# Patient Record
Sex: Male | Born: 1995 | Race: Black or African American | Hispanic: No | Marital: Single | Smoking: Never smoker
Health system: Southern US, Community
[De-identification: ages and names within clinical notes are randomized; demographics above are authoritative.]

## PROBLEM LIST (undated history)

## (undated) ENCOUNTER — Emergency Department (HOSPITAL_COMMUNITY): Payer: Self-pay | Source: Home / Self Care

---
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
## 2016-02-14 ENCOUNTER — Emergency Department (HOSPITAL_COMMUNITY)
Admission: EM | Admit: 2016-02-14 | Discharge: 2016-02-14 | Disposition: A | Discharge status: Home / Self Care | Payer: Self-pay | Attending: Emergency Medicine | Admitting: Emergency Medicine

## 2016-02-14 ENCOUNTER — Encounter (HOSPITAL_COMMUNITY): Payer: Self-pay

## 2016-02-14 DIAGNOSIS — N472 Paraphimosis: Secondary | ICD-10-CM | POA: Insufficient documentation

## 2016-02-14 DIAGNOSIS — A64 Unspecified sexually transmitted disease: Secondary | ICD-10-CM | POA: Insufficient documentation

## 2016-02-14 LAB — URINALYSIS, ROUTINE W REFLEX MICROSCOPIC
BILIRUBIN URINE: NEGATIVE
Glucose, UA: NEGATIVE mg/dL
KETONES UR: NEGATIVE mg/dL
NITRITE: NEGATIVE
Protein, ur: NEGATIVE mg/dL
Specific Gravity, Urine: 1.019 (ref 1.005–1.030)
pH: 7 (ref 5.0–8.0)

## 2016-02-14 LAB — URINE MICROSCOPIC-ADD ON

## 2016-02-14 MED ORDER — CEFTRIAXONE SODIUM 250 MG IJ SOLR
250.0000 mg | Freq: Once | INTRAMUSCULAR | Status: AC
  Administered 2016-02-14: 250 mg via INTRAMUSCULAR
  Filled 2016-02-14: qty 250

## 2016-02-14 MED ORDER — AZITHROMYCIN 250 MG PO TABS
1000.0000 mg | ORAL_TABLET | Freq: Once | ORAL | Status: AC
  Administered 2016-02-14: 1000 mg via ORAL
  Filled 2016-02-14: qty 4

## 2016-02-14 MED ORDER — STERILE WATER FOR INJECTION IJ SOLN
INTRAMUSCULAR | Status: AC
  Filled 2016-02-14: qty 10

## 2016-02-14 NOTE — Discharge Instructions (Signed)
Mr. Jesse Villanueva,  Nice meeting you! Please follow-up with urology and your primary care provider. Return to the emergency department if you develop fevers, chills, inability to urinate, black/blue penis, increased pain, new/worsening symptoms. Feel better soon!  S. Lane HackerNicole Reggie Welge, PA-C  Paraphimosis Paraphimosis is a serious condition in which the fold of skin that stretches over the tip of the penis (foreskin) becomes stuck when it is pulled back. This condition blocks blood from flowing away from the penis tip, and that causes swelling that gets worse and worse. Paraphimosis needs to be treated right away. CAUSES This condition may be caused by:  A foreskin that is tighter than normal.  Leaving the foreskin pulled back after a procedure, such as after the placement of a catheter.  A foreskin that has been pulled back for too long.  A forceful pulling back of the foreskin.  Infection under the foreskin.  Trauma to the area, such as a hard hit to the the tip of the penis.  Having sex or masturbating.  Hair or clothing that gets wrapped around the penis. RISK FACTORS This condition is more likely to develop in:  Males who have not had their foreskin removed.  Males who have frequent urological procedures.  Males who have poor hygiene.  Males who need help with daily hygiene from a caregiver. SYMPTOMS Symptoms of this condition include:  A foreskin that cannot be returned to its normal position.  Pain, often at the tip of the penis.  Swelling of the penis.  Anxiety.  Trouble urinating.  Skin that is red or bluish at the tip of the penis. DIAGNOSIS This condition may be diagnosed with a physical exam. You may also have X-rays. TREATMENT This condition may be treated with:  A procedure to force the foreskin back over the tip of the penis. Swelling may first be reduced with:  Ice.  A bandage wrapped tightly around the penis.  Needles to drain any pus or blood  that is causing the swelling.  Medicine to relieve pain. Medicine may be given by mouth, through an IV tube, or by an injection to the base of the penis (nerve block).  A procedure in which a small cut (incision) is made in the tightened foreskin to free it and allow it to be pulled back into place (dorsal slit procedure).  Surgery to remove the foreskin (circumcision). This may be done if the foreskin cannot be moved back into place. Most of the time, treatment can be done in a clinic or a health care provider's office. HOME CARE INSTRUCTIONS  Take over-the-counter and prescription medicines only as told by your health care provider.  Apply any creams to the affected area only as told by your health care provider.  If the treated area is covered with gauze or a bandage (dressing), follow instructions from your health care provider about:  When and how you should change your bandage (dressing).  When you should remove your dressing.  Whether the area can get wet.  Wear loose undergarments to avoid applying pressure to the area.  Follow instructions from your health care provider about avoiding sexual activity.  Keep all follow-up visits as told by your health care provider. This is important. SEEK MEDICAL CARE IF:  The treated area of skin does not heal, or it becomes more irritated, red, or bloody.  Urination is difficult or painful.  Pain in the penis continues, even after you take medicine for pain. SEEK IMMEDIATE MEDICAL CARE IF:  You develop a fever.   This information is not intended to replace advice given to you by your health care provider. Make sure you discuss any questions you have with your health care provider.   Document Released: 06/26/2009 Document Revised: 05/20/2015 Document Reviewed: 11/24/2014 Elsevier Interactive Patient Education 2016 [employer].  Sexually Transmitted Disease A sexually transmitted disease (STD) is a disease or infection that may  be passed (transmitted) from person to person, usually during sexual activity. This may happen by way of saliva, semen, blood, vaginal mucus, or urine. Common STDs include:  Gonorrhea.  Chlamydia.  Syphilis.  HIV and AIDS.  Genital herpes.  Hepatitis B and C.  Trichomonas.  Human papillomavirus (HPV).  Pubic lice.  Scabies.  Mites.  Bacterial vaginosis. WHAT ARE CAUSES OF STDs? An STD may be caused by bacteria, a virus, or parasites. STDs are often transmitted during sexual activity if one person is infected. However, they may also be transmitted through nonsexual means. STDs may be transmitted after:   Sexual intercourse with an infected person.  Sharing sex toys with an infected person.  Sharing needles with an infected person or using unclean piercing or tattoo needles.  Having intimate contact with the genitals, mouth, or rectal areas of an infected person.  Exposure to infected fluids during birth. WHAT ARE THE SIGNS AND SYMPTOMS OF STDs? Different STDs have different symptoms. Some people may not have any symptoms. If symptoms are present, they may include:  Painful or bloody urination.  Pain in the pelvis, abdomen, vagina, anus, throat, or eyes.  A skin rash, itching, or irritation.  Growths, ulcerations, blisters, or sores in the genital and anal areas.  Abnormal vaginal discharge with or without bad odor.  Penile discharge in men.  Fever.  Pain or bleeding during sexual intercourse.  Swollen glands in the groin area.  Yellow skin and eyes (jaundice). This is seen with hepatitis.  Swollen testicles.  Infertility.  Sores and blisters in the mouth. HOW ARE STDs DIAGNOSED? To make a diagnosis, your health care provider may:  Take a medical history.  Perform a physical exam.  Take a sample of any discharge to examine.  Swab the throat, cervix, opening to the penis, rectum, or vagina for testing.  Test a sample of your first morning  urine.  Perform blood tests.  Perform a Pap test, if this applies.  Perform a colposcopy.  Perform a laparoscopy. HOW ARE STDs TREATED? Treatment depends on the STD. Some STDs may be treated but not cured.  Chlamydia, gonorrhea, trichomonas, and syphilis can be cured with antibiotic medicine.  Genital herpes, hepatitis, and HIV can be treated, but not cured, with prescribed medicines. The medicines lessen symptoms.  Genital warts from HPV can be treated with medicine or by freezing, burning (electrocautery), or surgery. Warts may come back.  HPV cannot be cured with medicine or surgery. However, abnormal areas may be removed from the cervix, vagina, or vulva.  If your diagnosis is confirmed, your recent sexual partners need treatment. This is true even if they are symptom-free or have a negative culture or evaluation. They should not have sex until their health care providers say it is okay.  Your health care provider may test you for infection again 3 months after treatment. HOW CAN I REDUCE MY RISK OF GETTING AN STD? Take these steps to reduce your risk of getting an STD:  Use latex condoms, dental dams, and water-soluble lubricants during sexual activity. Do not use petroleum jelly  or oils.  Avoid having multiple sex partners.  Do not have sex with someone who has other sex partners  Do not have sex with anyone you do not know or who is at high risk for an STD.  Avoid risky sex practices that can break your skin.  Do not have sex if you have open sores on your mouth or skin.  Avoid drinking too much alcohol or taking illegal drugs. Alcohol and drugs can affect your judgment and put you in a vulnerable position.  Avoid engaging in oral and anal sex acts.  Get vaccinated for HPV and hepatitis. If you have not received these vaccines in the past, talk to your health care provider about whether one or both might be right for you.  If you are at risk of being infected with  HIV, it is recommended that you take a prescription medicine daily to prevent HIV infection. This is called pre-exposure prophylaxis (PrEP). You are considered at risk if:  You are a man who has sex with other men (MSM).  You are a heterosexual man or woman and are sexually active with more than one partner.  You take drugs by injection.  You are sexually active with a partner who has HIV.  Talk with your health care provider about whether you are at high risk of being infected with HIV. If you choose to begin PrEP, you should first be tested for HIV. You should then be tested every 3 months for as long as you are taking PrEP. WHAT SHOULD I DO IF I THINK I HAVE AN STD?  See your health care provider.  Tell your sexual partner(s). They should be tested and treated for any STDs.  Do not have sex until your health care provider says it is okay. WHEN SHOULD I GET IMMEDIATE MEDICAL CARE? Contact your health care provider right away if:   You have severe abdominal pain.  You are a man and notice swelling or pain in your testicles.  You are a woman and notice swelling or pain in your vagina.   This information is not intended to replace advice given to you by your health care provider. Make sure you discuss any questions you have with your health care provider.   Document Released: 11/19/2002 Document Revised: 09/19/2014 Document Reviewed: 03/19/2013 Elsevier Interactive Patient Education 2016 Elsevier Inc.   Paraphimosis Paraphimosis is a serious condition in which the fold of skin that stretches over the tip of the penis (foreskin) becomes stuck when it is pulled back. This condition blocks blood from flowing away from the penis tip, and that causes swelling that gets worse and worse. Paraphimosis needs to be treated right away. CAUSES This condition may be caused by:  A foreskin that is tighter than normal.  Leaving the foreskin pulled back after a procedure, such as after the  placement of a catheter.  A foreskin that has been pulled back for too long.  A forceful pulling back of the foreskin.  Infection under the foreskin.  Trauma to the area, such as a hard hit to the the tip of the penis.  Having sex or masturbating.  Hair or clothing that gets wrapped around the penis. RISK FACTORS This condition is more likely to develop in:  Males who have not had their foreskin removed.  Males who have frequent urological procedures.  Males who have poor hygiene.  Males who need help with daily hygiene from a caregiver. SYMPTOMS Symptoms of this condition include:  A foreskin that cannot be returned to its normal position.  Pain, often at the tip of the penis.  Swelling of the penis.  Anxiety.  Trouble urinating.  Skin that is red or bluish at the tip of the penis. DIAGNOSIS This condition may be diagnosed with a physical exam. You may also have X-rays. TREATMENT This condition may be treated with:  A procedure to force the foreskin back over the tip of the penis. Swelling may first be reduced with:  Ice.  A bandage wrapped tightly around the penis.  Needles to drain any pus or blood that is causing the swelling.  Medicine to relieve pain. Medicine may be given by mouth, through an IV tube, or by an injection to the base of the penis (nerve block).  A procedure in which a small cut (incision) is made in the tightened foreskin to free it and allow it to be pulled back into place (dorsal slit procedure).  Surgery to remove the foreskin (circumcision). This may be done if the foreskin cannot be moved back into place. Most of the time, treatment can be done in a clinic or a health care provider's office. HOME CARE INSTRUCTIONS  Take over-the-counter and prescription medicines only as told by your health care provider.  Apply any creams to the affected area only as told by your health care provider.  If the treated area is covered with gauze  or a bandage (dressing), follow instructions from your health care provider about:  When and how you should change your bandage (dressing).  When you should remove your dressing.  Whether the area can get wet.  Wear loose undergarments to avoid applying pressure to the area.  Follow instructions from your health care provider about avoiding sexual activity.  Keep all follow-up visits as told by your health care provider. This is important. SEEK MEDICAL CARE IF:  The treated area of skin does not heal, or it becomes more irritated, red, or bloody.  Urination is difficult or painful.  Pain in the penis continues, even after you take medicine for pain. SEEK IMMEDIATE MEDICAL CARE IF:  You develop a fever.   This information is not intended to replace advice given to you by your health care provider. Make sure you discuss any questions you have with your health care provider.   Document Released: 06/26/2009 Document Revised: 05/20/2015 Document Reviewed: 11/24/2014 Elsevier Interactive Patient Education [employer]

## 2016-02-14 NOTE — ED Notes (Signed)
Patient uncircumcised and developed swelling and discomfort to foreskin this am. Denies discharge, no urinary complaints

## 2016-02-14 NOTE — ED Provider Notes (Signed)
CSN: [id number]     Arrival date & time 02/14/16  1153 History   First MD Initiated Contact with Patient 02/14/16 1320     Chief Complaint  Patient presents with  . foreskin infection    HPI   Jesse Villanueva is a 20 y.o. male presenting with a 1 day history of penile swelling. He states he had swelling at the tip of his penis yesterday, and was able to push it back down. This morning, he awoke, and the swelling returned. He has been unable to reduce it. He denies fevers, chills, N/V, inability to urinate, significant pain, discolorations. He is uncircumcised.    History reviewed. No pertinent past medical history. History reviewed. No pertinent past surgical history. No family history on file. Social History  Substance Use Topics  . Smoking status: Never Smoker   . Smokeless tobacco: None  . Alcohol Use: No    Review of Systems  Ten systems are reviewed and are negative for acute change except as noted in the HPI   Allergies  Review of patient's allergies indicates no known allergies.  Home Medications   Prior to Admission medications   Not on File   BP 133/72 mmHg  Pulse 80  Temp(Src) 98 F (36.7 C) (Oral)  Resp 20  Ht 5\' 11"  (1.803 m)  Wt 72.666 kg  BMI 22.35 kg/m2  SpO2 100% Physical Exam  Constitutional: He appears well-developed and well-nourished. No distress.  HENT:  Head: Normocephalic and atraumatic.  Eyes: Conjunctivae are normal. Right eye exhibits no discharge. Left eye exhibits no discharge. No scleral icterus.  Neck: No tracheal deviation present.  Cardiovascular: Normal rate.   Pulmonary/Chest: Effort normal and breath sounds normal. No respiratory distress.  Abdominal: Soft. Bowel sounds are normal. He exhibits no distension and no mass. There is no tenderness. There is no rebound and no guarding.  Genitourinary:  Chaperoned penile exam: Paraphimosis at urethral surface of penis. Foreskin able to be manually reduced but not completely able to cover  glans penis. No tenderness, discolorations. Urethral discharge noted.   Musculoskeletal: He exhibits no edema.  Lymphadenopathy:    He has no cervical adenopathy.  Neurological: He is alert. Coordination normal.  Skin: Skin is warm and dry. No rash noted. He is not diaphoretic. No erythema.  Psychiatric: He has a normal mood and affect. His behavior is normal.  Nursing note and vitals reviewed.   ED Course  Procedures  Labs Review Labs Reviewed  URINALYSIS, ROUTINE W REFLEX MICROSCOPIC ([facility]) - Abnormal; Notable for the following:    APPearance TURBID (*)    Hgb urine dipstick SMALL (*)    Leukocytes, UA LARGE (*)    All other components within normal limits  URINE MICROSCOPIC-ADD ON - Abnormal; Notable for the following:    Squamous Epithelial / LPF 0-5 (*)    Bacteria, UA RARE (*)    All other components within normal limits  GC/CHLAMYDIA PROBE AMP ([place]) NOT AT [facility]   MDM   Final diagnoses:  Paraphimosis  Sexually transmitted disease   Patient nontoxic appearing, VSS. Partial paraphimosis of penis. No discolorations, tenderness, difficulty urinating. No concern for ischemia. Dr. Adela LankFloyd evaluated patient as well. Manual reduction attempted but not completely sucessful. Patient states he was able to reduce at home but then it reappeared again. Patient treated for STD given penile discharge, leukocytes in urine. Patient to follow-up with urology. Return precautions discussed. Patient in agreement and understanding with the plan.  Lelon MastSamantha  Lane Hacker, PA-C 02/19/16 1401  Melene Plan, DO 02/19/16 1427

## 2016-02-15 LAB — GC/CHLAMYDIA PROBE AMP (~~LOC~~) NOT AT ARMC
Chlamydia: NEGATIVE
Neisseria Gonorrhea: POSITIVE — AB

## 2016-02-16 ENCOUNTER — Telehealth (HOSPITAL_BASED_OUTPATIENT_CLINIC_OR_DEPARTMENT_OTHER): Payer: Self-pay | Admitting: Emergency Medicine

## 2016-04-10 ENCOUNTER — Encounter (HOSPITAL_COMMUNITY): Payer: Self-pay | Admitting: Emergency Medicine

## 2016-04-10 ENCOUNTER — Emergency Department (HOSPITAL_COMMUNITY): Payer: Self-pay

## 2016-04-10 ENCOUNTER — Emergency Department (HOSPITAL_COMMUNITY)
Admission: EM | Admit: 2016-04-10 | Discharge: 2016-04-10 | Disposition: A | Discharge status: Home / Self Care | Payer: Self-pay | Attending: Emergency Medicine | Admitting: Emergency Medicine

## 2016-04-10 DIAGNOSIS — Y999 Unspecified external cause status: Secondary | ICD-10-CM | POA: Insufficient documentation

## 2016-04-10 DIAGNOSIS — S6991XA Unspecified injury of right wrist, hand and finger(s), initial encounter: Secondary | ICD-10-CM

## 2016-04-10 DIAGNOSIS — Z23 Encounter for immunization: Secondary | ICD-10-CM | POA: Insufficient documentation

## 2016-04-10 DIAGNOSIS — Y939 Activity, unspecified: Secondary | ICD-10-CM | POA: Insufficient documentation

## 2016-04-10 DIAGNOSIS — Y929 Unspecified place or not applicable: Secondary | ICD-10-CM | POA: Insufficient documentation

## 2016-04-10 DIAGNOSIS — W2201XA Walked into wall, initial encounter: Secondary | ICD-10-CM | POA: Insufficient documentation

## 2016-04-10 DIAGNOSIS — S60412A Abrasion of right middle finger, initial encounter: Secondary | ICD-10-CM | POA: Insufficient documentation

## 2016-04-10 MED ORDER — TETANUS-DIPHTH-ACELL PERTUSSIS 5-2.5-18.5 LF-MCG/0.5 IM SUSP
0.5000 mL | Freq: Once | INTRAMUSCULAR | Status: AC
  Administered 2016-04-10: 0.5 mL via INTRAMUSCULAR
  Filled 2016-04-10: qty 0.5

## 2016-04-10 MED ORDER — CEPHALEXIN 500 MG PO CAPS: 500.0000 mg | ORAL_CAPSULE | Freq: Four times a day (QID) | ORAL | 0 refills | Status: AC

## 2016-04-10 MED ORDER — CEPHALEXIN 250 MG PO CAPS
500.0000 mg | ORAL_CAPSULE | Freq: Once | ORAL | Status: AC
  Administered 2016-04-10: 500 mg via ORAL
  Filled 2016-04-10: qty 2

## 2016-04-10 NOTE — Discharge Instructions (Signed)
Take the prescribed medication as directed. Follow-up with Dr. Amanda Pea (hand specialist) if not improving over the next few days. Return to the ED for new or worsening symptoms.

## 2016-04-10 NOTE — ED Provider Notes (Signed)
[facility]-EMERGENCY DEPT Provider Note   CSN: [id number] Arrival date & time: 04/10/16  1191  First Provider Contact:  First MD Initiated Contact with Patient 04/10/16 [phone]        History   Chief Complaint Chief Complaint  Patient presents with  . Hand Injury    HPI Jesse Villanueva is a 20 y.o. male.  The history is provided by the patient and a significant other.   19 year old male here with right hand injury. Patient states 2 days ago he got upset and punched a wall. He states he had a abrasion to his knuckle of his middle finger. He states he had significant swelling which seems to be improving. He reports continued pain of his right hand. He also notes some drainage from open wound.  No fever, chills.  No numbness or weakness of right hand.  Patient is right hand dominant.  No meds given PTA.  Date of last tetanus unknown.  History reviewed. No pertinent past medical history.  There are no active problems to display for this patient.   History reviewed. No pertinent surgical history.     Home Medications    Prior to Admission medications   Not on File    Family History No family history on file.  Social History Social History  Substance Use Topics  . Smoking status: Never Smoker  . Smokeless tobacco: Not on file  . Alcohol use No     Allergies   Review of patient's allergies indicates no known allergies.   Review of Systems Review of Systems  Musculoskeletal: Positive for arthralgias.  All other systems reviewed and are negative.    Physical Exam Updated Vital Signs BP 123/86 (BP Location: Left Arm)   Pulse 62   Temp 100.6 F (38.1 C) (Oral)   Resp 18   Ht  (1.778 m)   Wt 70.3 kg   SpO2 100%   BMI 22.24 kg/m   Physical Exam  Constitutional: He is oriented to person, place, and time. He appears well-developed and well-nourished.  HENT:  Head: Normocephalic and atraumatic.  Mouth/Throat: Oropharynx is clear and moist.  Eyes:  Conjunctivae and EOM are normal. Pupils are equal, round, and reactive to light.  Neck: Normal range of motion.  Cardiovascular: Normal rate, regular rhythm and normal heart sounds.   Pulmonary/Chest: Effort normal and breath sounds normal.  Abdominal: Soft. Bowel sounds are normal.  Musculoskeletal: Normal range of motion.       Right hand: He exhibits tenderness, bony tenderness and swelling. He exhibits normal range of motion.       Hands: Right 3rd MCP joint with abrasion noted, slightly open with some drainage noted and generalized swelling surrounding wound; no erythema or induration noted; full ROM of all fingers; normal radial pulse and cap refill; normal sensation throughout hand  Neurological: He is alert and oriented to person, place, and time.  Skin: Skin is warm and dry.  Psychiatric: He has a normal mood and affect.  Nursing note and vitals reviewed.    ED Treatments / Results  Labs (all labs ordered are listed, but only abnormal results are displayed) Labs Reviewed - No data to display  EKG  EKG Interpretation None       Radiology Dg Hand Complete Right  Result Date: 04/10/2016 CLINICAL DATA:  20 year old male punched a wall presenting with right hand pain. EXAM: RIGHT HAND - COMPLETE 3+ VIEW COMPARISON:  None. FINDINGS: There is no acute fracture or dislocation. The bones  are well mineralized. There is soft tissue swelling over the MCP joints. No radiopaque foreign object. IMPRESSION: Negative. Electronically Signed   By: Elgie Collard M.D.   On: 04/10/2016 06:32   Procedures Procedures (including critical care time)  Medications Ordered in ED Medications  Tdap (BOOSTRIX) injection 0.5 mL (not administered)  cephALEXin (KEFLEX) capsule 500 mg (not administered)     Initial Impression / Assessment and Plan / ED Course  I have reviewed the triage vital signs and the nursing notes.  Pertinent labs & imaging results that were available during my care of  the patient were reviewed by me and considered in my medical decision making (see chart for details).  Clinical Course   20 y.o. M here with right hand injury after punching a wall 2 days ago.  Small abrasion noted over 3rd MCP joint with some drainage noted.  Mild swelling without bony deformities.  Hand is neurovascularly intact.  No erythema or induration.  X-ray negative for acute bony deformities.  Tetanus updated.  Will start on abx given drainage.  Discussed home wound care measures. Given hand surgery follow-up if not improving over the next few days.  Discussed plan with patient, he acknowledged understanding and agreed with plan of care.  Return precautions given for new or worsening symptoms.  Final Clinical Impressions(s) / ED Diagnoses   Final diagnoses:  Hand injury, right, initial encounter  Abrasion of third finger of right hand, initial encounter    New Prescriptions New Prescriptions   CEPHALEXIN (KEFLEX) 500 MG CAPSULE    Take 1 capsule (500 mg total) by mouth 4 (four) times daily.     Garlon Hatchet, PA-C 04/10/16 [id number]    Cathren Laine, MD 05/11/16 1009

## 2016-04-10 NOTE — ED Triage Notes (Signed)
Pt. punched a wall 2 days ago , presents with right had swelling and drainage at knuckle wound.

## 2018-07-17 IMAGING — CR DG HAND COMPLETE 3+V*R*
3 series · 3 of 3 positions shown · non-contrast
Comparison: None.

CLINICAL DATA: 20-year-old male punched a wall presenting with
right hand pain.

EXAM:
RIGHT HAND - COMPLETE 3+ VIEW

[hand pa]
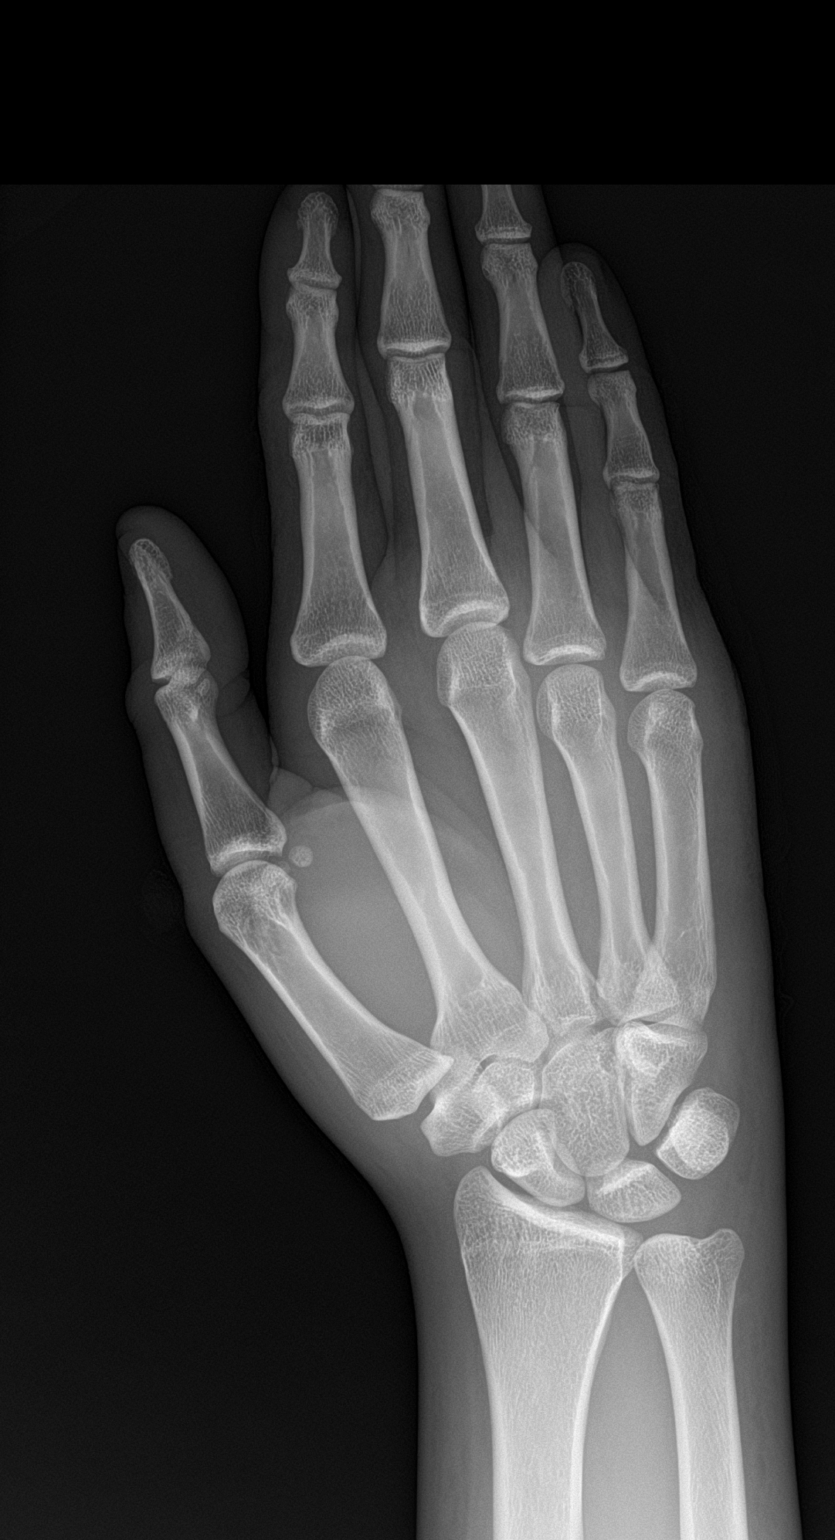

[hand obl]
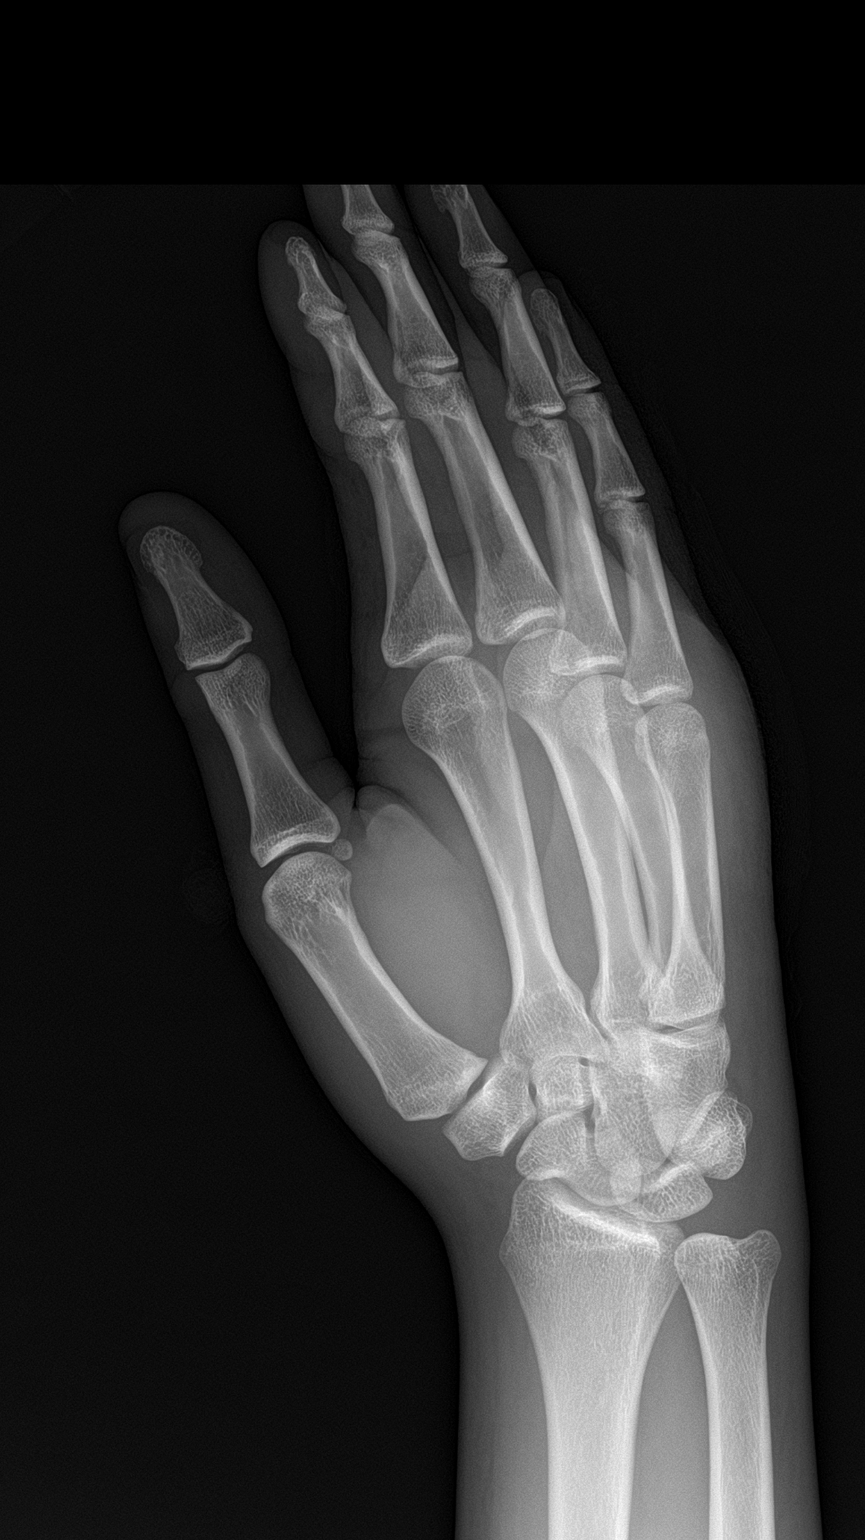

[hand lat]
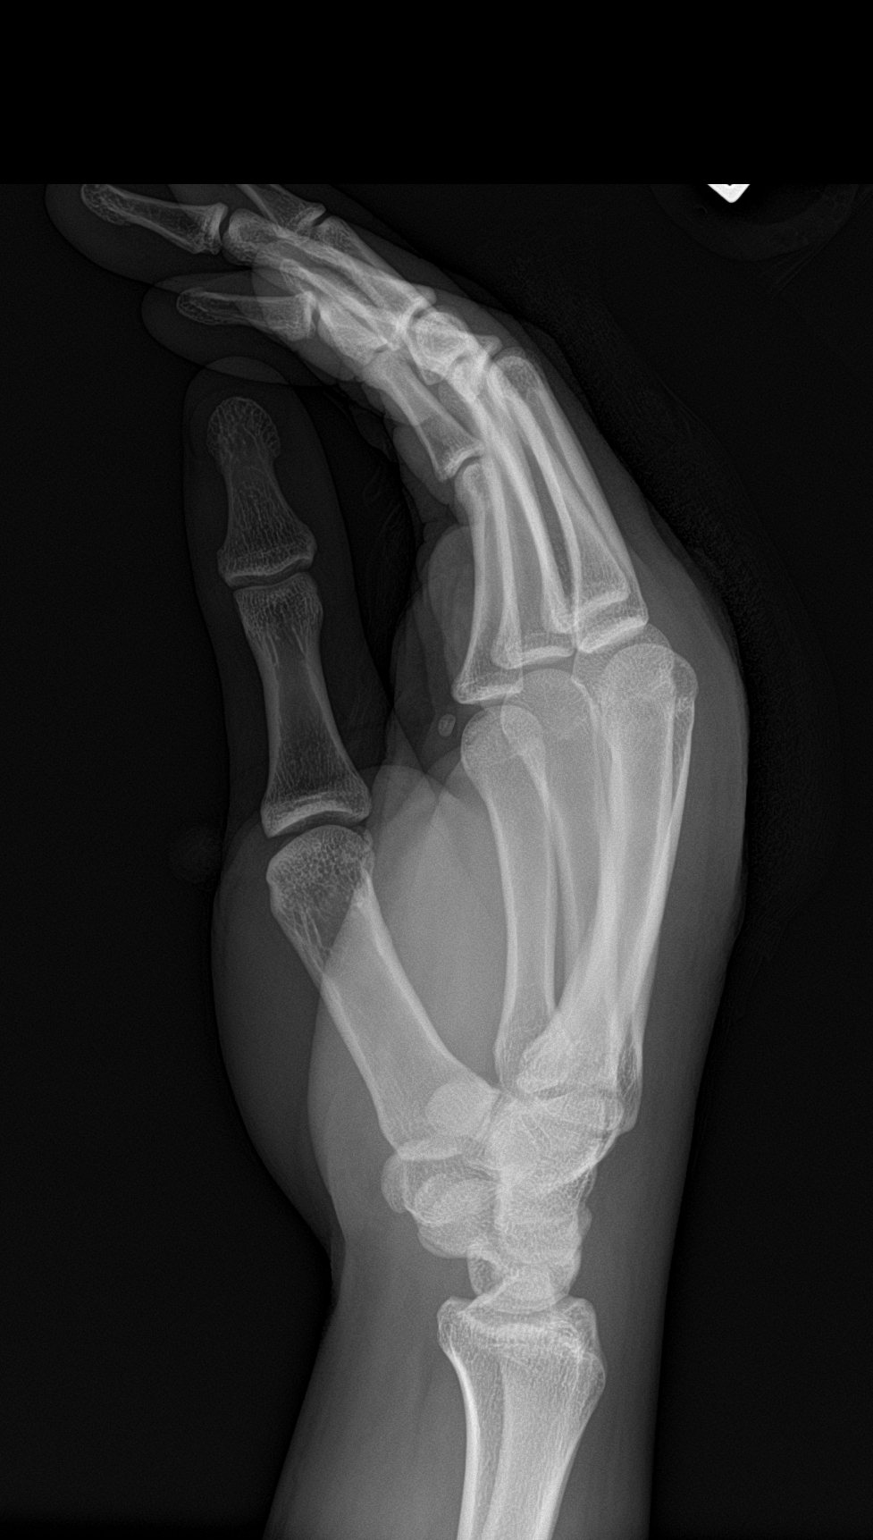

[3 of 3 positions shown; findings below may reference images not displayed]

FINDINGS: There is no acute fracture or dislocation. The bones are well
mineralized. There is soft tissue swelling over the MCP joints. No
radiopaque foreign object.
IMPRESSION: Negative.

## 2020-01-26 ENCOUNTER — Other Ambulatory Visit: Payer: Self-pay

## 2020-01-26 ENCOUNTER — Emergency Department (HOSPITAL_COMMUNITY)
Admission: EM | Admit: 2020-01-26 | Discharge: 2020-01-26 | Disposition: A | Discharge status: Home / Self Care | Payer: Self-pay | Attending: Emergency Medicine | Admitting: Emergency Medicine

## 2020-01-26 ENCOUNTER — Encounter (HOSPITAL_COMMUNITY): Payer: Self-pay | Admitting: *Deleted

## 2020-01-26 DIAGNOSIS — A749 Chlamydial infection, unspecified: Secondary | ICD-10-CM | POA: Insufficient documentation

## 2020-01-26 DIAGNOSIS — A549 Gonococcal infection, unspecified: Secondary | ICD-10-CM | POA: Insufficient documentation

## 2020-01-26 DIAGNOSIS — Z79899 Other long term (current) drug therapy: Secondary | ICD-10-CM | POA: Insufficient documentation

## 2020-01-26 DIAGNOSIS — Z711 Person with feared health complaint in whom no diagnosis is made: Secondary | ICD-10-CM

## 2020-01-26 LAB — HIV ANTIBODY (ROUTINE TESTING W REFLEX): HIV Screen 4th Generation wRfx: NONREACTIVE

## 2020-01-26 LAB — RAPID HIV SCREEN (HIV 1/2 AB+AG)
HIV 1/2 Antibodies: NONREACTIVE
HIV-1 P24 Antigen - HIV24: NONREACTIVE

## 2020-01-26 NOTE — ED Notes (Signed)
Pt d/c home per MD order. Discharge summary reviewed, pt verbalizes understanding. Ambulatory off unit. No s/s of acute distress noted.  

## 2020-01-26 NOTE — Discharge Instructions (Addendum)
You will be called if your results are positive.  If positive will you will need need to be reassessed by the health department, urgent care her in the emergency department for antibiotics at that time.  Otherwise if your test is negative I would suggest following up with the health department for regular STD testing

## 2020-01-26 NOTE — ED Triage Notes (Signed)
The pt reports that he is highly sexual active and he just wants to be checked

## 2020-01-26 NOTE — ED Provider Notes (Signed)
[facility] EMERGENCY DEPARTMENT Provider Note   CSN: [id number] Arrival date & time: 01/26/20  4431    History Chief Complaint  Patient presents with  . Exposure to STD    Jesse Villanueva is a 24 y.o. male with past medical history significant for gonorrhea who presents for evaluation of requesting antibiotics.  Patient states he is sexually active with multiple male partners.  Uses protection.  Does have history of gonorrhea.  He denies any fever, chills, nausea, vomiting, abdominal pain, dysuria, hematuria, pain with bowel movements, rashes, lesions, testicular pain, redness or swelling.  Denies additional aggravating or relieving factors.  Patient denies any known positive partners.  History obtained from patient and past medical records.  No interpreter is used.     HPI     History reviewed. No pertinent past medical history.  There are no problems to display for this patient.   History reviewed. No pertinent surgical history.     No family history on file.  Social History   Tobacco Use  . Smoking status: Never Smoker  . Smokeless tobacco: Never Used  Substance Use Topics  . Alcohol use: No  . Drug use: Yes    Types: Marijuana    Home Medications Prior to Admission medications   Medication Sig Start Date End Date Taking? Authorizing Provider  cephALEXin (KEFLEX) 500 MG capsule Take 1 capsule (500 mg total) by mouth 4 (four) times daily. 04/10/16   Larene Pickett, PA-C    Allergies    Patient has no known allergies.  Review of Systems   Review of Systems  Constitutional: Negative.   HENT: Negative.   Respiratory: Negative.   Cardiovascular: Negative.   Gastrointestinal: Negative.   Genitourinary: Negative.   Musculoskeletal: Negative.   Skin: Negative.   Neurological: Negative.   All other systems reviewed and are negative.   Physical Exam Updated Vital Signs BP 131/84 (BP Location: Left Arm)   Pulse 87   Temp 97.6 F  (36.4 C) (Oral)   Resp 14   Ht 5\' 10"  (1.778 m)   Wt 81.6 kg   SpO2 99%   BMI 25.83 kg/m   Physical Exam Vitals and nursing note reviewed.  Constitutional:      General: He is not in acute distress.    Appearance: He is well-developed. He is not ill-appearing, toxic-appearing or diaphoretic.  HENT:     Head: Atraumatic.     Nose: Nose normal.     Mouth/Throat:     Mouth: Mucous membranes are moist.  Eyes:     Pupils: Pupils are equal, round, and reactive to light.  Cardiovascular:     Rate and Rhythm: Normal rate and regular rhythm.     Pulses: Normal pulses.     Heart sounds: Normal heart sounds.  Pulmonary:     Effort: Pulmonary effort is normal. No respiratory distress.     Breath sounds: Normal breath sounds.  Abdominal:     General: Bowel sounds are normal. There is no distension.     Palpations: Abdomen is soft.     Tenderness: There is no abdominal tenderness. There is no guarding or rebound.  Genitourinary:    Comments: Declined GU exam Musculoskeletal:        General: Normal range of motion.     Cervical back: Normal range of motion and neck supple.  Skin:    General: Skin is warm and dry.     Capillary Refill: Capillary refill takes less  than 2 seconds.  Neurological:     General: No focal deficit present.     Mental Status: He is alert and oriented to person, place, and time.    ED Results / Procedures / Treatments   Labs (all labs ordered are listed, but only abnormal results are displayed) Labs Reviewed  HIV ANTIBODY (ROUTINE TESTING W REFLEX)  RAPID HIV SCREEN (HIV 1/2 AB+AG)  GC/CHLAMYDIA PROBE AMP () NOT AT [facility]    EKG None  Radiology No results found.  Procedures Procedures (including critical care time)  Medications Ordered in ED Medications - No data to display  ED Course  I have reviewed the triage vital signs and the nursing notes.  Pertinent labs & imaging results that were available during my care of the patient  were reviewed by me and considered in my medical decision making (see chart for details).  24 year old male presents for evaluation of requesting STD testing.  Afebrile, nonseptic, non-ill-appearing.  Sexually active with females only.  States he does use condoms for protection.  Multiple sexual partners.  He denies any complaints, specifically no abdominal pain, diarrhea, pain with bowel movement, penile discharge, testicular pain, redness or swelling.  No rashes or lesions.  Patient declined GU exam.  Denies known STD exposures.   Patient is afebrile without abdominal tenderness, abdominal pain or painful bowel movements to indicate prostatitis.  Declines GU exam however denies pain of the testes or epididymis to suggest orchitis or epididymitis.  STD cultures obtained including HIV, syphilis, gonorrhea and chlamydia. Patient to be discharged with instructions to follow up with Health department. Discussed importance of using protection when sexually active. Pt understands that they have GC/Chlamydia cultures pending and that they will need to inform all sexual partners if results return positive.  Given he is asymptomatic will defer empiric antibiotics until testing has returned.  The patient has been appropriately medically screened and/or stabilized in the ED. I have low suspicion for any other emergent medical condition which would require further screening, evaluation or treatment in the ED or require inpatient management.  Patient is hemodynamically stable and in no acute distress.  Patient able to ambulate in department prior to ED.  Evaluation does not show acute pathology that would require ongoing or additional emergent interventions while in the emergency department or further inpatient treatment.  I have discussed the diagnosis with the patient and answered all questions.  Pain is been managed while in the emergency department and patient has no further complaints prior to discharge.  Patient  is comfortable with plan discussed in room and is stable for discharge at this time.  I have discussed strict return precautions for returning to the emergency department.  Patient was encouraged to follow-up with PCP/specialist refer to at discharge.     MDM Rules/Calculators/A&P                      Final Clinical Impression(s) / ED Diagnoses Final diagnoses:  Concern about STD in male without diagnosis    Rx / DC Orders ED Discharge Orders    None       Nahuel Wilbert A, PA-C 01/26/20 0258    Alvira Monday, MD 01/27/20 1506

## 2020-01-27 LAB — GC/CHLAMYDIA PROBE AMP (~~LOC~~) NOT AT ARMC
Chlamydia: POSITIVE — AB
Comment: NEGATIVE
Comment: NORMAL
Neisseria Gonorrhea: POSITIVE — AB
